# Patient Record
Sex: Male | Born: 1980 | Hispanic: No | Marital: Single | Smoking: Never smoker
Health system: Southern US, Community
[De-identification: ages and names within clinical notes are randomized; demographics above are authoritative.]

## PROBLEM LIST (undated history)

## (undated) HISTORY — PX: PLACEMENT OF BREAST IMPLANTS: SHX6334

---

## 2000-12-31 ENCOUNTER — Emergency Department (HOSPITAL_COMMUNITY): Admission: EM | Admit: 2000-12-31 | Discharge: 2000-12-31 | Payer: Self-pay | Admitting: Emergency Medicine

## 2001-03-26 ENCOUNTER — Emergency Department (HOSPITAL_COMMUNITY): Admission: EM | Admit: 2001-03-26 | Discharge: 2001-03-26 | Payer: Self-pay | Admitting: Emergency Medicine

## 2001-03-26 ENCOUNTER — Encounter: Payer: Self-pay | Admitting: Emergency Medicine

## 2013-10-10 ENCOUNTER — Emergency Department (HOSPITAL_COMMUNITY)
Admission: EM | Admit: 2013-10-10 | Discharge: 2013-10-10 | Disposition: A | Discharge status: Home / Self Care | Payer: Self-pay | Attending: Emergency Medicine | Admitting: Emergency Medicine

## 2013-10-10 ENCOUNTER — Encounter (HOSPITAL_COMMUNITY): Payer: Self-pay | Admitting: Emergency Medicine

## 2013-10-10 DIAGNOSIS — N644 Mastodynia: Secondary | ICD-10-CM | POA: Insufficient documentation

## 2013-10-10 DIAGNOSIS — Z9889 Other specified postprocedural states: Secondary | ICD-10-CM | POA: Insufficient documentation

## 2013-10-10 MED ORDER — IBUPROFEN 600 MG PO TABS: 600.0000 mg | ORAL_TABLET | Freq: Four times a day (QID) | ORAL | Status: AC | PRN

## 2013-10-10 MED ORDER — IBUPROFEN 400 MG PO TABS
800.0000 mg | ORAL_TABLET | Freq: Once | ORAL | Status: AC
  Administered 2013-10-10: 800 mg via ORAL
  Filled 2013-10-10: qty 2

## 2013-10-10 NOTE — Discharge Instructions (Signed)
°  Breast Sonogram This is an ultrasound of the breast to help determine whether a lump in the breast is a cyst (a fluid-filled sac), a solid tumor or a growth. It can help determine whether a tumor is benign (non-cancerous) or cancerous. It can also help locate a small nodule in the breast that can not be felt so that it can be localized for removal during surgery. PREPARATION FOR TEST No preparation is necessary. NORMAL FINDINGS  No evidence of a cyst or tumor. Ranges for normal findings may vary among different laboratories and hospitals. You should always check with your doctor after having lab work or other tests done to discuss the meaning of your test results and whether your values are considered within normal limits. MEANING OF TEST  Your caregiver will go over the test results with you and discuss the importance and meaning of your results, as well as treatment options and the need for additional tests if necessary. OBTAINING THE TEST RESULTS  It is your responsibility to obtain your test results. Ask the lab or department performing the test when and how you will get your results. Document Released: 10/06/2004 Document Revised: 12/07/2011 Document Reviewed: 08/22/2008 [facility] Patient Information 2014 [employer].

## 2013-10-10 NOTE — ED Provider Notes (Signed)
CSN: [id number]     Arrival date & time 10/10/13  0007 History   First MD Initiated Contact with Patient 10/10/13 0120     Chief Complaint  Patient presents with  . Breast Pain   (Consider location/radiation/quality/duration/timing/severity/associated sxs/prior Treatment) HPI Comments: Patient is a 33 year old male who presents today for right breast pain. Patient recently had saline breast implants placed 7 months ago in [place]. He states he was cleaning his tub and toilet when the pain began. Pain is throbbing in nature and radiates towards the nipple. He has not taken anything for his symptoms. Patient denies associated fever, shortness of breath, nipple discharge, numbness or tingling in his right upper extremity, extremity weakness, breast masses or swelling, and direct/trauma or injury to the area.  The history is provided by the patient. No language interpreter was used.    History reviewed. No pertinent past medical history. Past Surgical History  Procedure Laterality Date  . Placement of breast implants     No family history on file. History  Substance Use Topics  . Smoking status: Never Smoker   . Smokeless tobacco: Not on file  . Alcohol Use: No    Review of Systems  Constitutional: Negative for fever.  Respiratory: Negative for shortness of breath.   Musculoskeletal: Positive for myalgias (R breast pain).  Skin: Negative for color change and pallor.  Neurological: Negative for weakness and numbness.  All other systems reviewed and are negative.    Allergies  Review of patient's allergies indicates no known allergies.  Home Medications   Current Outpatient Rx  Name  Route  Sig  Dispense  Refill  . ibuprofen (ADVIL,MOTRIN) 600 MG tablet   Oral   Take 1 tablet (600 mg total) by mouth every 6 (six) hours as needed.   30 tablet   0    BP 133/91  Pulse 85  Temp(Src) 98.8 F (37.1 C) (Oral)  Resp 14  Ht 5\' 7"  (1.702 m)  Wt 188 lb (85.276 kg)  BMI  29.44 kg/m2  SpO2 99%  Physical Exam  Nursing note and vitals reviewed. Constitutional: He is oriented to person, place, and time. He appears well-developed and well-nourished. No distress.  HENT:  Head: Normocephalic and atraumatic.  Eyes: Conjunctivae and EOM are normal. No scleral icterus.  Neck: Normal range of motion.  Cardiovascular: Normal rate, regular rhythm and normal heart sounds.   Pulmonary/Chest: Effort normal and breath sounds normal. No respiratory distress. He has no wheezes. He has no rales. He exhibits no bony tenderness, no crepitus, no deformity and no retraction. Right breast exhibits tenderness. Right breast exhibits no inverted nipple, no mass, no nipple discharge and no skin change. Left breast exhibits no inverted nipple, no mass, no nipple discharge, no skin change and no tenderness.    Musculoskeletal: Normal range of motion.  Neurological: He is alert and oriented to person, place, and time.  Patient moves extremities without ataxia. No gross sensory deficits appreciated. Normal shoulder shrug.  Skin: Skin is warm and dry. No rash noted. He is not diaphoretic. No erythema. No pallor.  Psychiatric: He has a normal mood and affect. His behavior is normal.    ED Course  Procedures (including critical care time) Labs Review Labs Reviewed - No data to display Imaging Review No results found.  EKG Interpretation   None       MDM   1. Breast pain, right    Uncomplicated right breast pain in a 33 year old male with  a recent history of saline breast implants bilaterally. [place] ago [place]. Patient neurovascularly intact and moves extremities without ataxia. Heart RRR and lungs CTAB. No masses or irregularities appreciated. Have consulted with radiologist, Dr. Grace IsaacWatts, who recommends outpatient U/S imaging and f/u with breast clinic as no there are no emergent indications for imaging today. Patient stable for d/c with U/S imaging  as outpatient and instruction to f/u with Breast Clinic. Ibuprofen for pain control. Return precautions discussed and patient agreeable to plan with no unaddressed concerns.   Filed Vitals:   10/10/13 0015 10/10/13 0213  BP: 133/91 134/72  Pulse: 85 80  Temp: 98.8 F (37.1 C)   TempSrc: Oral   Resp: 14 20  Height: 5\' 7"  (1.702 m)   Weight: 188 lb (85.276 kg)   SpO2: 99% 100%     Juan Green, Juan Green 10/10/13 0220

## 2013-10-10 NOTE — ED Notes (Signed)
Pt. reports right breast pain ( breast implant) onset this evening after lifting heavy basket at home , no deformity or discharge .

## 2013-10-10 NOTE — ED Provider Notes (Signed)
Medical screening examination/treatment/procedure(s) were performed by non-physician practitioner and as supervising physician I was immediately available for consultation/collaboration.  EKG Interpretation   None         Hanley SeamenJohn L Millenia Waldvogel, MD 10/10/13 [phone]

## 2013-10-25 ENCOUNTER — Other Ambulatory Visit: Payer: Self-pay | Admitting: Emergency Medicine

## 2013-10-25 DIAGNOSIS — N644 Mastodynia: Secondary | ICD-10-CM

## 2013-10-25 DIAGNOSIS — Z9882 Breast implant status: Secondary | ICD-10-CM

## 2021-05-04 ENCOUNTER — Emergency Department (HOSPITAL_COMMUNITY)
Admission: EM | Admit: 2021-05-04 | Discharge: 2021-05-04 | Disposition: A | Discharge status: Home / Self Care | Payer: Self-pay | Attending: Emergency Medicine | Admitting: Emergency Medicine

## 2021-05-04 ENCOUNTER — Other Ambulatory Visit: Payer: Self-pay

## 2021-05-04 ENCOUNTER — Emergency Department (HOSPITAL_COMMUNITY): Payer: Self-pay

## 2021-05-04 DIAGNOSIS — M25562 Pain in left knee: Secondary | ICD-10-CM | POA: Insufficient documentation

## 2021-05-04 DIAGNOSIS — Y9229 Other specified public building as the place of occurrence of the external cause: Secondary | ICD-10-CM | POA: Insufficient documentation

## 2021-05-04 DIAGNOSIS — M25572 Pain in left ankle and joints of left foot: Secondary | ICD-10-CM | POA: Insufficient documentation

## 2021-05-04 DIAGNOSIS — W010XXA Fall on same level from slipping, tripping and stumbling without subsequent striking against object, initial encounter: Secondary | ICD-10-CM | POA: Insufficient documentation

## 2021-05-04 NOTE — ED Triage Notes (Signed)
Pt presented to ED with c/o pain to left knee/leg after ground level fall while wearing high heel while--------- at night club. Denies any other symptoms at this time.

## 2021-05-04 NOTE — ED Provider Notes (Signed)
Emergency Medicine Provider Triage Evaluation Note  Wojciech Willetts , a 40 y.o. male  was evaluated in triage.  Pt complains of left leg pain.  Sustained a ground level fall.  Tripped on high heels.  Complains of left knee and left ankle pain.  Able to ambulate, but with pain.  Denies any other injuries.  Denies treatment PTA.  Review of Systems  Positive: Arthralgia, fall Negative: Head injury, weakness/numbness  Physical Exam  BP (!) 129/95 (BP Location: Left Arm)   Pulse (!) 102   Temp 98.8 F (37.1 C) (Oral)   Resp 18   Ht 5\' 7"  (1.702 m)   Wt 86.2 kg   SpO2 100%   BMI 29.76 kg/m  Gen:   Awake, no distress   Resp:  Normal effort  MSK:   Antalgic gait, left ankle ROM and strength limited by pain, left knee ROM and strength normal Other:    Medical Decision Making  Medically screening exam initiated at 3:46 AM.  Appropriate orders placed.  Marlow Berenguer was informed that the remainder of the evaluation will be completed by another provider, this initial triage assessment does not replace that evaluation, and the importance of remaining in the ED until their evaluation is complete.  [address]    [address], PA-C 05/04/21 07/04/21    [id number], MD 05/04/21 [phone]

## 2021-05-04 NOTE — ED Notes (Signed)
Called.

## 2021-05-04 NOTE — ED Notes (Signed)
Called patient to move to room, unable to locate at this time. 

## 2022-05-29 IMAGING — CR DG KNEE COMPLETE 4+V*L*
4 series · 4 of 4 positions shown · non-contrast
Comparison: None.

CLINICAL DATA: Ground level fall with left knee pain

EXAM:
LEFT KNEE - COMPLETE 4+ VIEW

[knee ap]
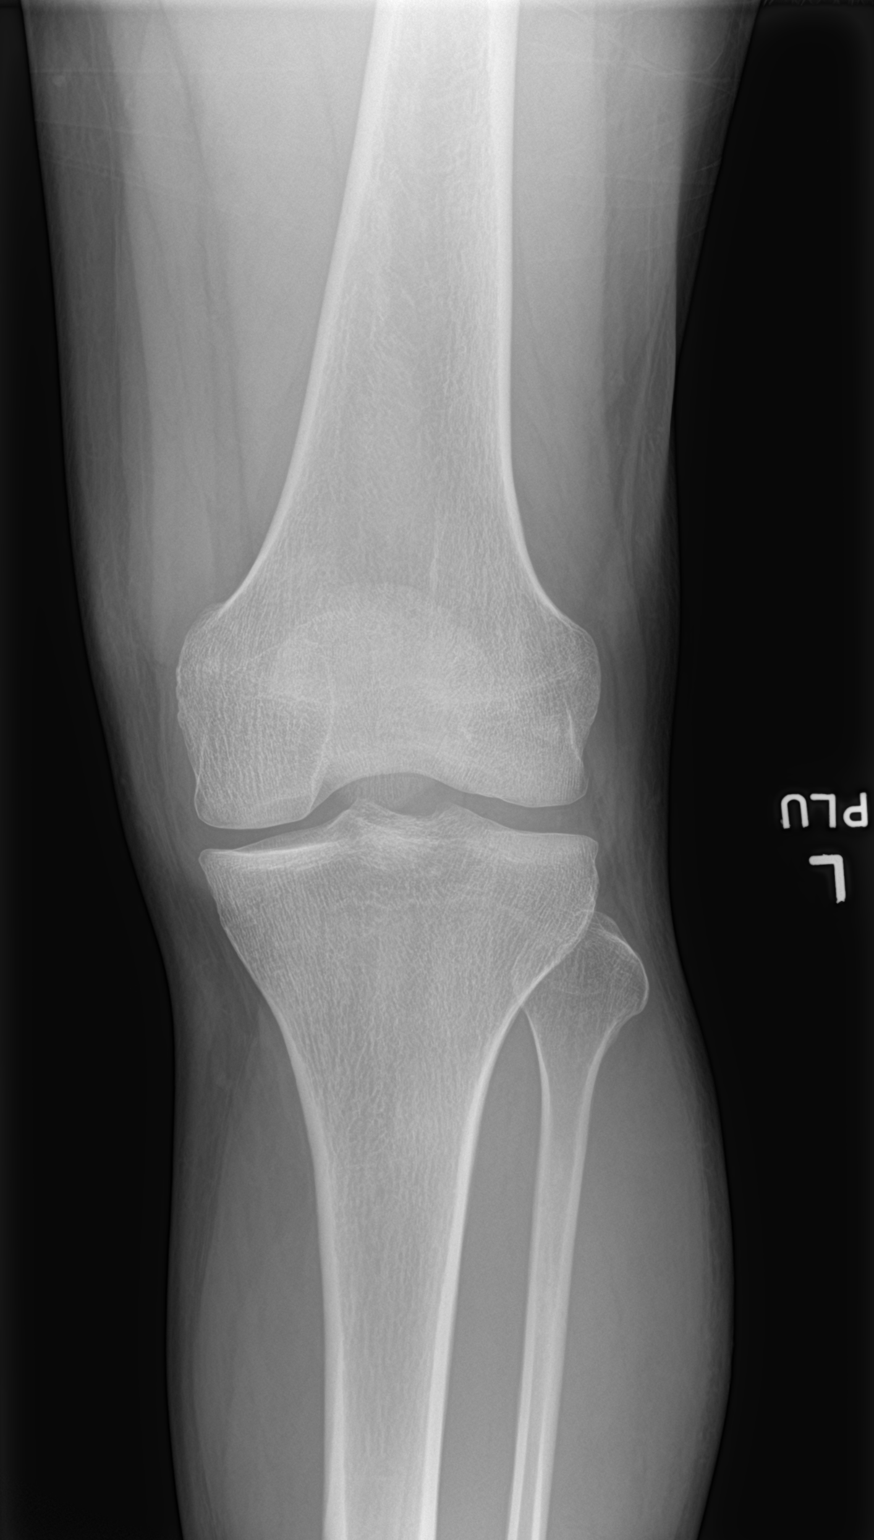

[knee lat]
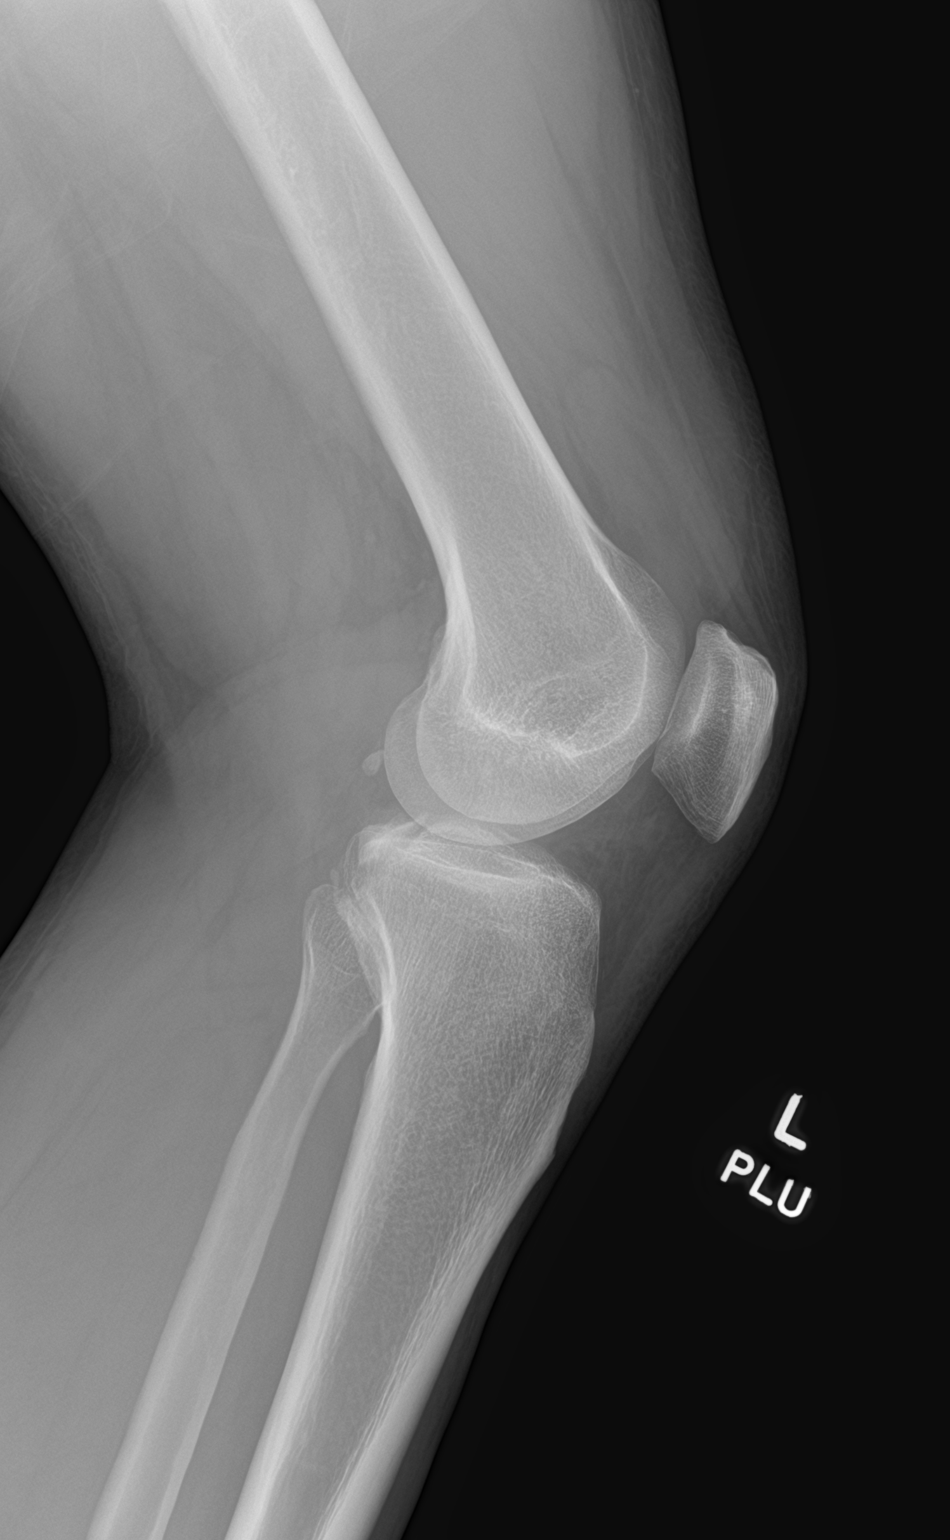

[knee obl (1 of 2)]
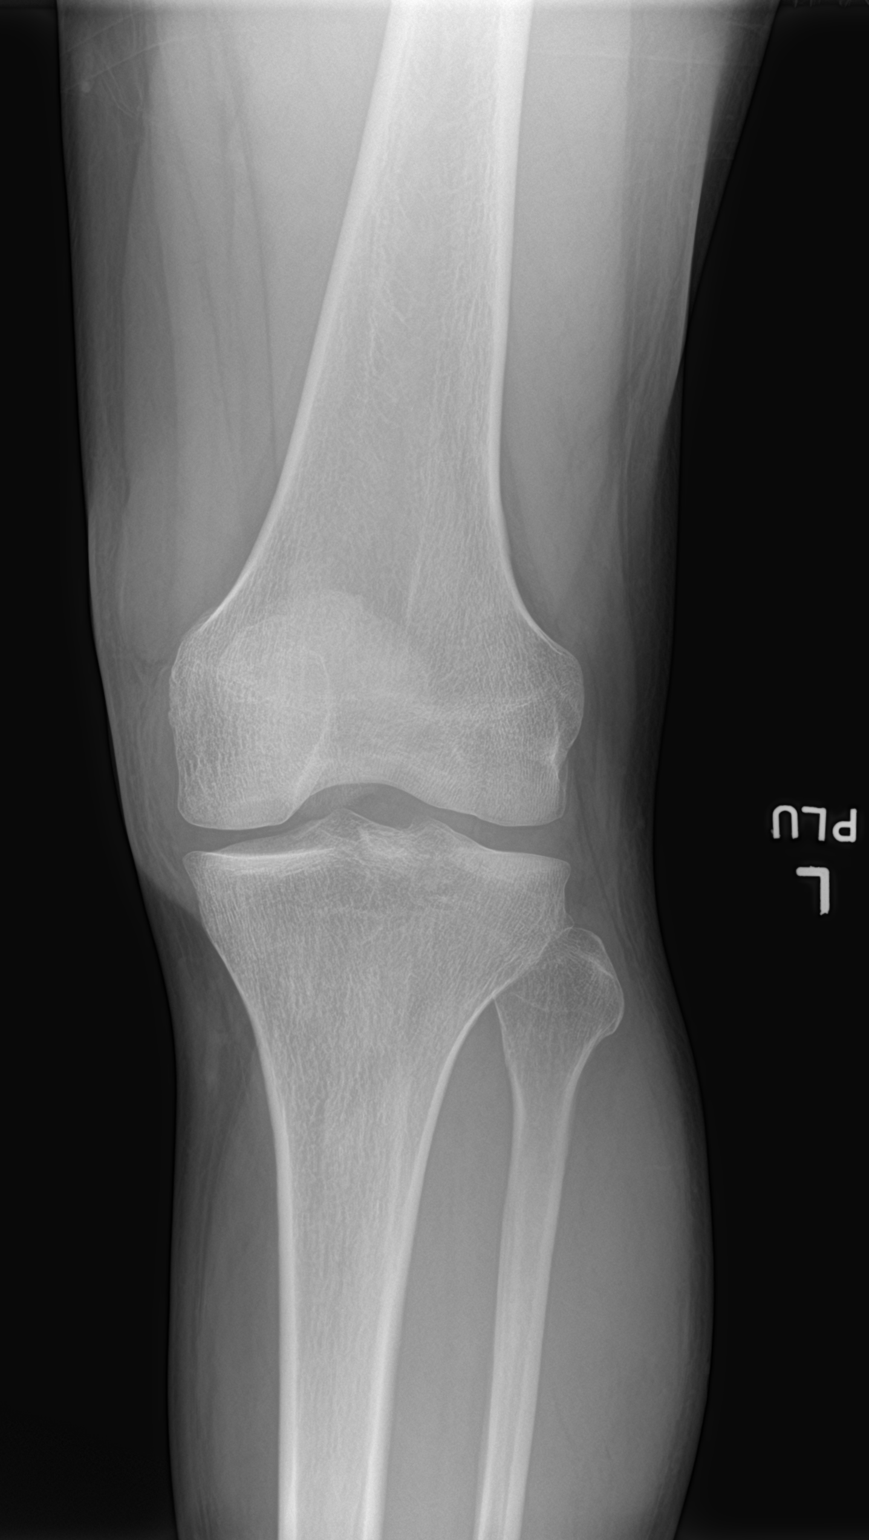

[knee obl (2 of 2)]
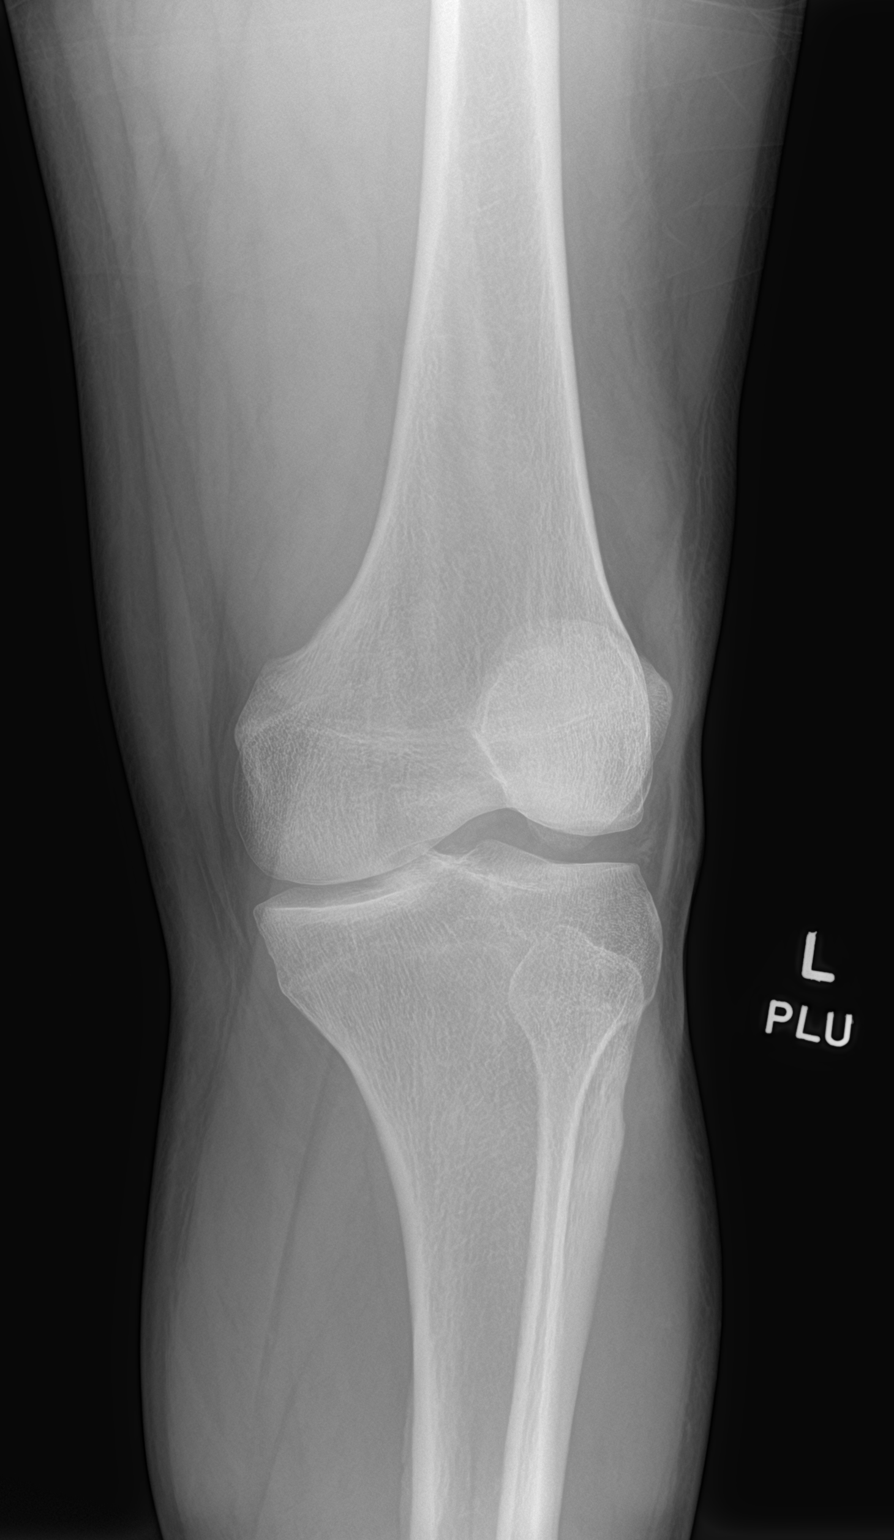

[4 of 4 positions shown; findings below may reference images not displayed]

FINDINGS: Evidence of posterior nondisplaced tibial plateau fracture but only
seen on the lateral view. A knee joint effusion is present. Normal
alignment.
IMPRESSION: 1. Suspect posterior tibial plateau fracture, but only seen on one
view. Recommend knee CT without contrast.
2. Joint effusion.

## 2022-05-29 IMAGING — CT CT KNEE*L* W/O CM
3 series · 12 of 33 positions shown, 14 images · non-contrast
Comparison: No prior CT exam.  Left knee radiograph 05/04/2021.

CLINICAL DATA: 39-year-old male with history of trauma from a fall
complaining of left-sided knee pain.

EXAM:
CT OF THE LEFT KNEE WITHOUT CONTRAST
TECHNIQUE: Multidetector CT imaging of the left knee was performed according to
the standard protocol. Multiplanar CT image reconstructions were
also generated.

[Series 5: extremity soft tissue · axial · 0.49mm/px · z∈[+520,+712]mm · 4 of 140 slices shown, 5 images]
[im 22/140  soft-tissue]
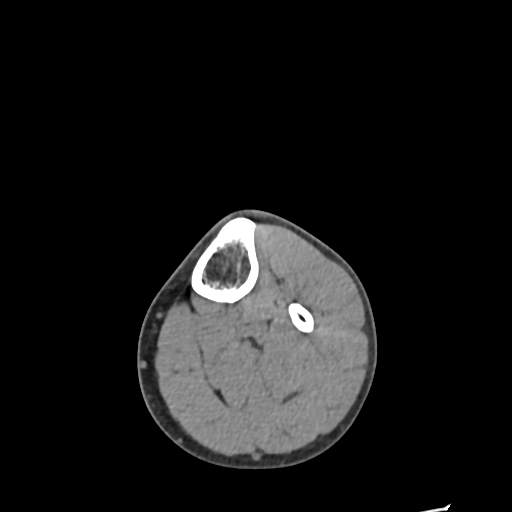
[im 22/140  bone]
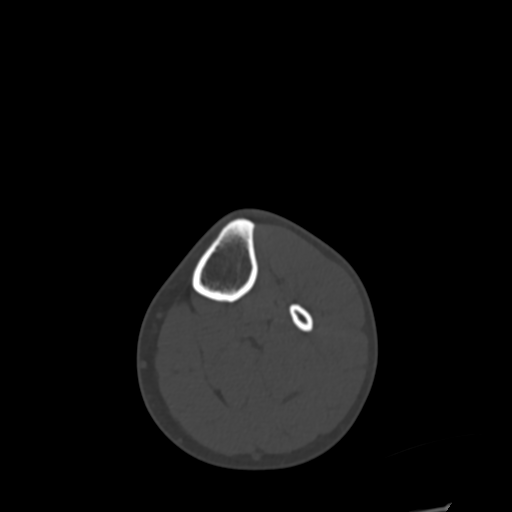
[im 54/140  bone]
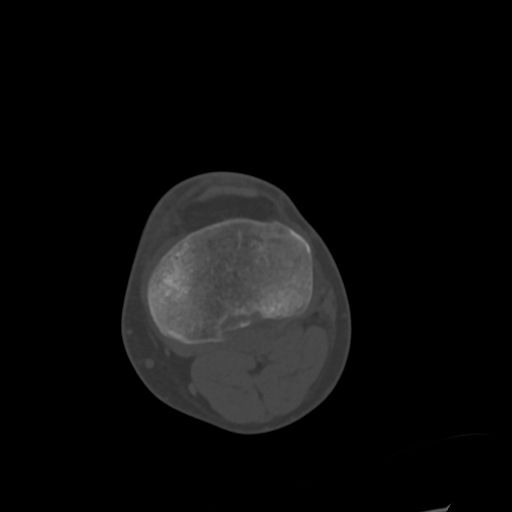
[im 86/140  bone]
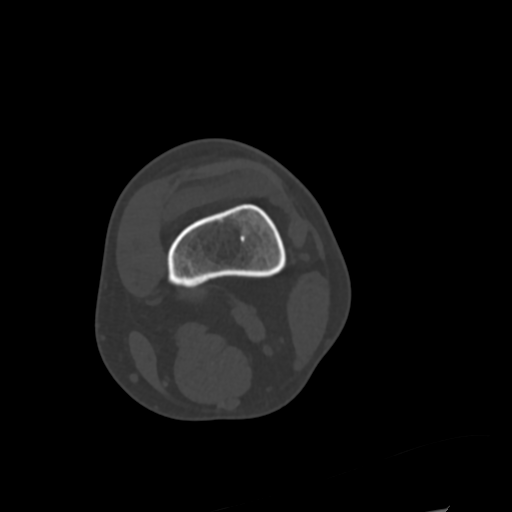
[im 118/140  bone]
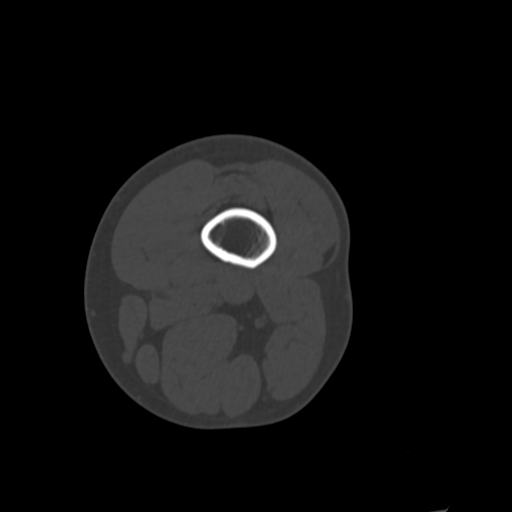

[Series 8: sag bone · sagittal · 0.36mm/px · 5 of 93 slices shown, 6 images]
[im 31/93  bone]
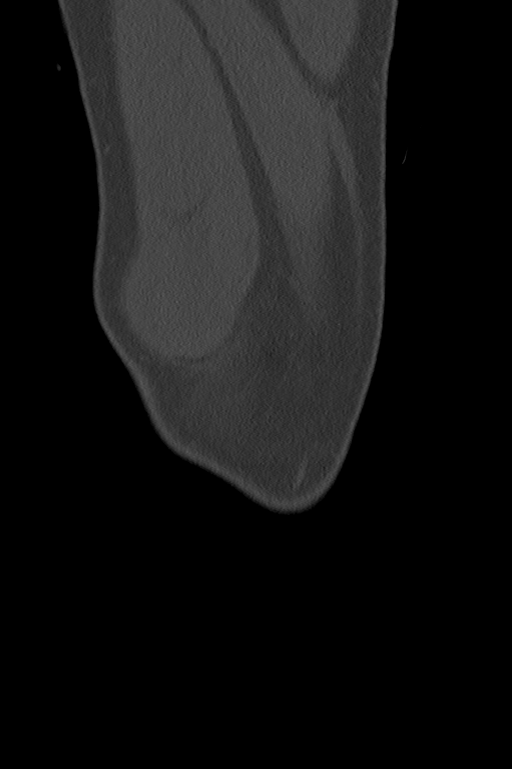
[im 39/93  bone]
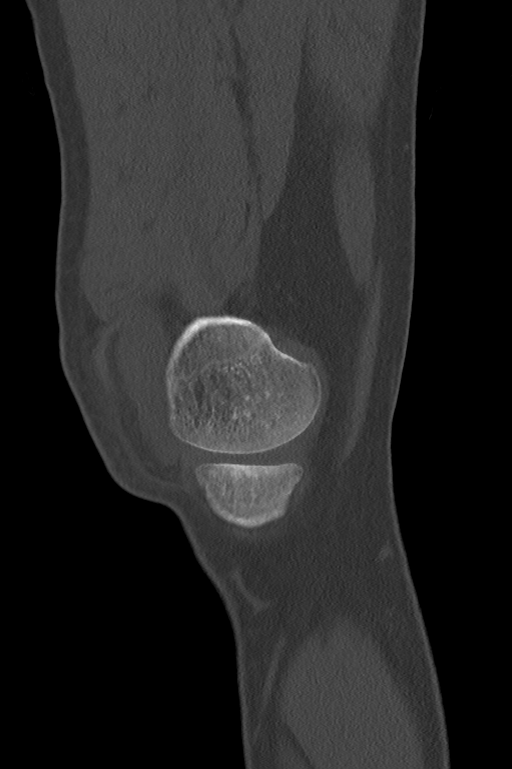
[im 47/93  soft-tissue]
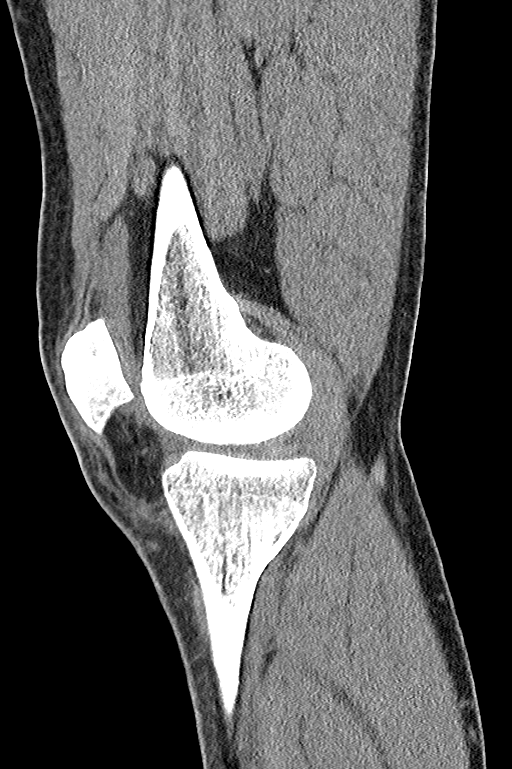
[im 47/93  bone]
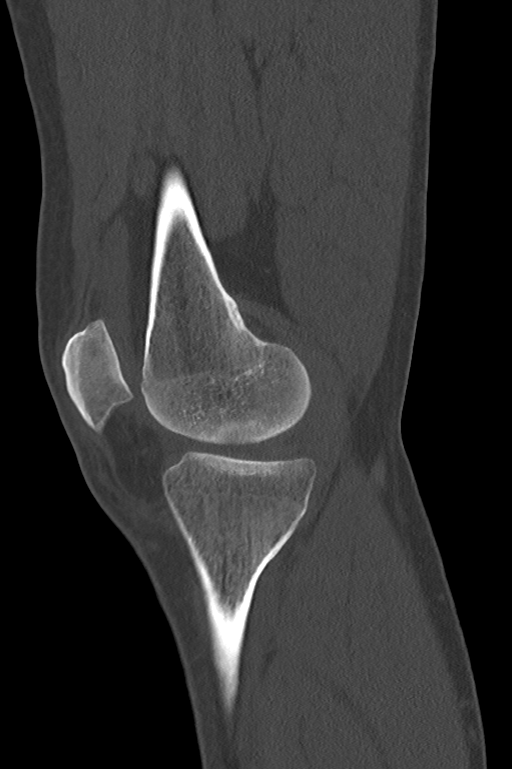
[im 54/93  bone]
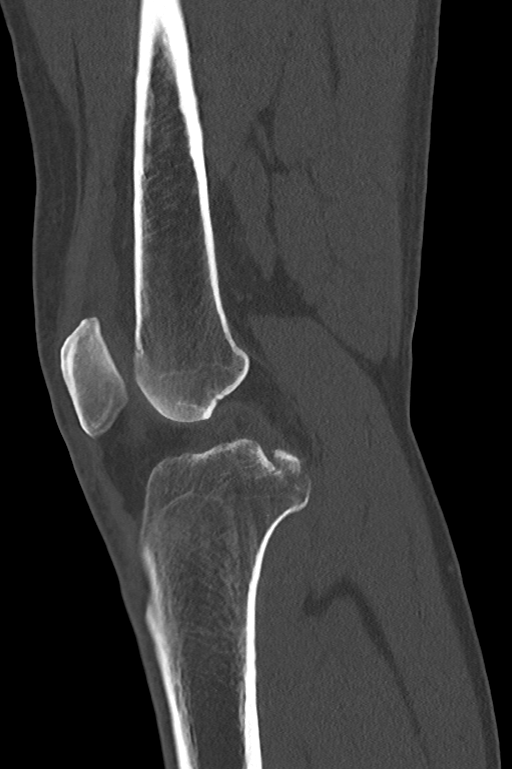
[im 62/93  bone]
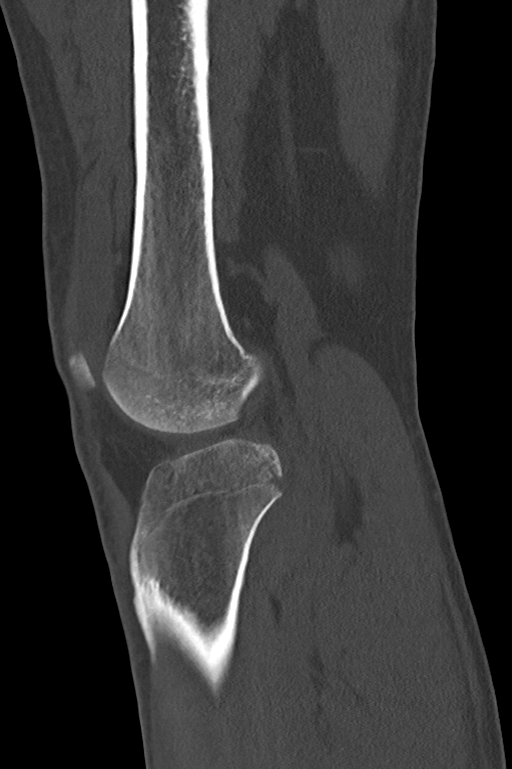

[Series 9: cor soft tissue · coronal · 0.30mm/px · 3 of 92 slices shown]
[im 19/92  bone]
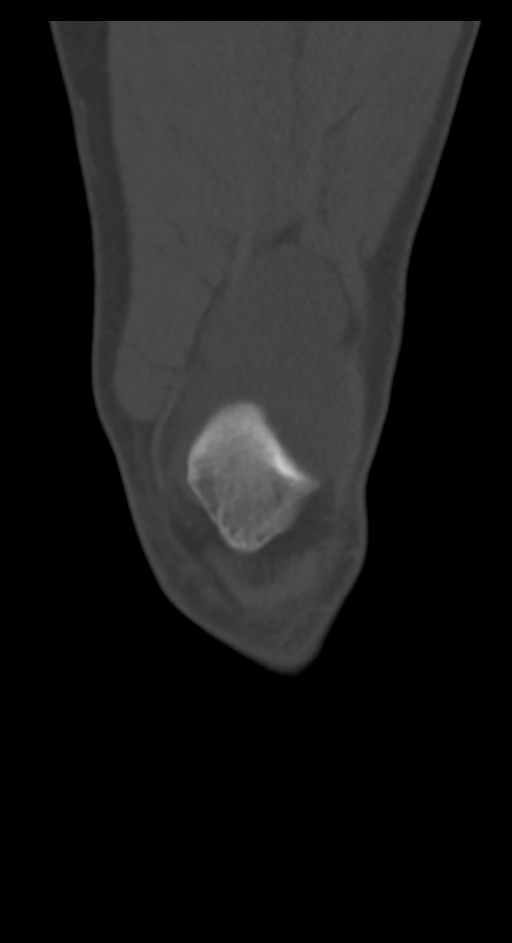
[im 37/92  bone]
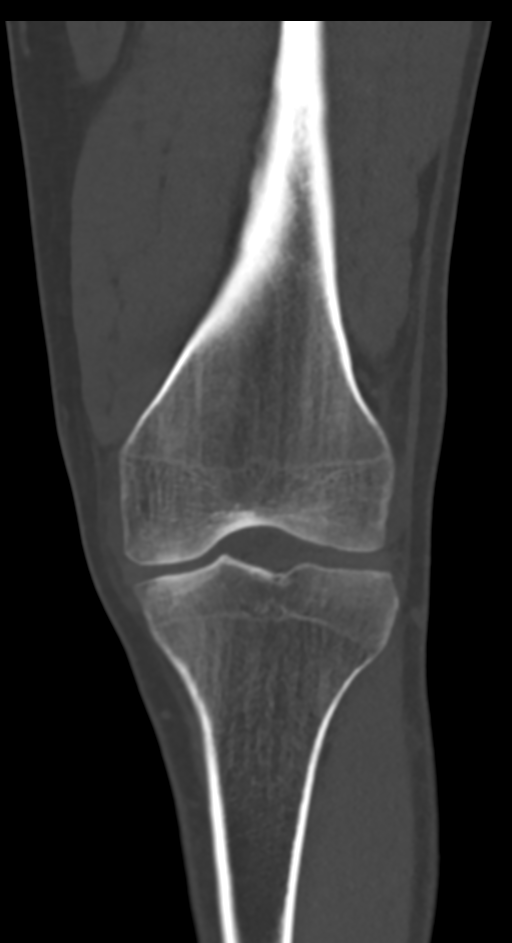
[im 55/92  bone]
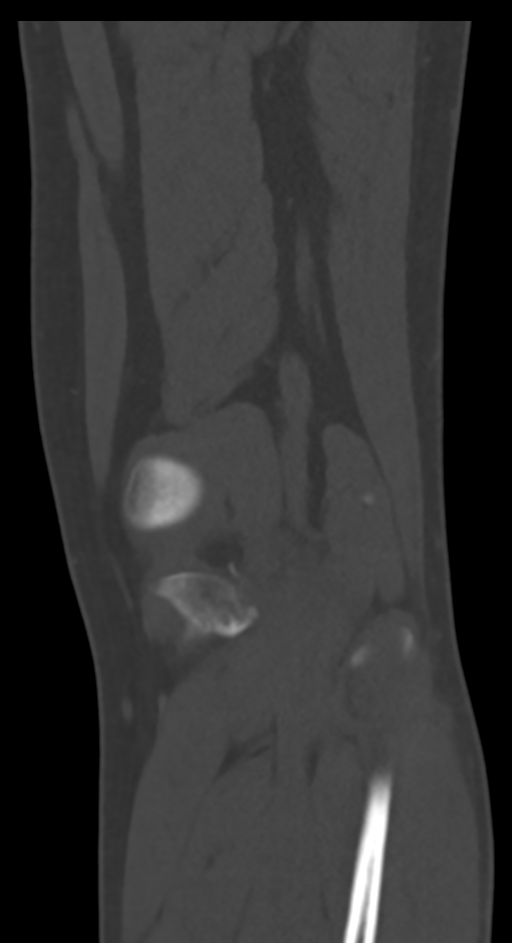

[12 of 33 positions shown; findings below may reference images not displayed]

FINDINGS: Bones/Joint/Cartilage

Minimally displaced fracture in the posterior aspect of the tibial
plateau in the region of the intercondylar notch at the insertion of
the posterior cruciate ligament (PCL). Distal femur, patella and
fibula are otherwise intact.

Ligaments

Suboptimally assessed by CT.

Muscles and Tendons

Unremarkable.

Soft tissues

Small amount of intermediate to high attenuation fluid in the joint
space, compatible with a hemorrhagic effusion.
IMPRESSION: 1. Minimally displaced avulsion fracture of the posterior aspect of
the tibia at the site of PCL insertion (type 2 PCL avulsion
fracture) with small hemarthrosis.
